# Patient Record
Sex: Male | Born: 1990 | Race: Black or African American | Hispanic: No | Marital: Single | Smoking: Former smoker
Health system: Southern US, Community
[De-identification: ages and names within clinical notes are randomized; demographics above are authoritative.]

---

## 2011-07-10 ENCOUNTER — Emergency Department (HOSPITAL_COMMUNITY): Payer: 59

## 2011-07-10 ENCOUNTER — Emergency Department (HOSPITAL_COMMUNITY)
Admission: EM | Admit: 2011-07-10 | Discharge: 2011-07-10 | Disposition: A | Discharge status: Home / Self Care | Payer: 59 | Attending: Emergency Medicine | Admitting: Emergency Medicine

## 2011-07-10 DIAGNOSIS — Y9367 Activity, basketball: Secondary | ICD-10-CM | POA: Insufficient documentation

## 2011-07-10 DIAGNOSIS — W219XXA Striking against or struck by unspecified sports equipment, initial encounter: Secondary | ICD-10-CM | POA: Insufficient documentation

## 2011-07-10 DIAGNOSIS — S20219A Contusion of unspecified front wall of thorax, initial encounter: Secondary | ICD-10-CM | POA: Insufficient documentation

## 2011-07-10 DIAGNOSIS — R079 Chest pain, unspecified: Secondary | ICD-10-CM | POA: Insufficient documentation

## 2012-07-11 ENCOUNTER — Encounter (HOSPITAL_COMMUNITY): Payer: Self-pay | Admitting: *Deleted

## 2012-07-11 ENCOUNTER — Emergency Department (HOSPITAL_COMMUNITY)
Admission: EM | Admit: 2012-07-11 | Discharge: 2012-07-11 | Disposition: A | Discharge status: Home / Self Care | Payer: 59 | Attending: Emergency Medicine | Admitting: Emergency Medicine

## 2012-07-11 DIAGNOSIS — R1013 Epigastric pain: Secondary | ICD-10-CM

## 2012-07-11 DIAGNOSIS — R112 Nausea with vomiting, unspecified: Secondary | ICD-10-CM | POA: Insufficient documentation

## 2012-07-11 DIAGNOSIS — F17200 Nicotine dependence, unspecified, uncomplicated: Secondary | ICD-10-CM | POA: Insufficient documentation

## 2012-07-11 LAB — CBC WITH DIFFERENTIAL/PLATELET
Basophils Relative: 0 % (ref 0–1)
Eosinophils Absolute: 0.2 10*3/uL (ref 0.0–0.7)
Eosinophils Relative: 2 % (ref 0–5)
HCT: 47.3 % (ref 39.0–52.0)
Lymphocytes Relative: 9 % — ABNORMAL LOW (ref 12–46)
Lymphs Abs: 1.1 10*3/uL (ref 0.7–4.0)
MCV: 89.9 fL (ref 78.0–100.0)
Neutro Abs: 10.2 10*3/uL — ABNORMAL HIGH (ref 1.7–7.7)
Platelets: 155 10*3/uL (ref 150–400)
RBC: 5.26 MIL/uL (ref 4.22–5.81)
WBC: 12.2 10*3/uL — ABNORMAL HIGH (ref 4.0–10.5)

## 2012-07-11 LAB — COMPREHENSIVE METABOLIC PANEL
ALT: 50 U/L (ref 0–53)
AST: 106 U/L — ABNORMAL HIGH (ref 0–37)
Calcium: 10.2 mg/dL (ref 8.4–10.5)
Creatinine, Ser: 0.78 mg/dL (ref 0.50–1.35)
Sodium: 142 mEq/L (ref 135–145)
Total Protein: 8.9 g/dL — ABNORMAL HIGH (ref 6.0–8.3)

## 2012-07-11 LAB — URINALYSIS, ROUTINE W REFLEX MICROSCOPIC
Glucose, UA: NEGATIVE mg/dL
Hgb urine dipstick: NEGATIVE
Leukocytes, UA: NEGATIVE
Specific Gravity, Urine: 1.016 (ref 1.005–1.030)
Urobilinogen, UA: 1 mg/dL (ref 0.0–1.0)

## 2012-07-11 MED ORDER — MORPHINE SULFATE 4 MG/ML IJ SOLN
4.0000 mg | Freq: Once | INTRAMUSCULAR | Status: AC
  Administered 2012-07-11: 4 mg via INTRAVENOUS
  Filled 2012-07-11: qty 1

## 2012-07-11 MED ORDER — FAMOTIDINE 20 MG PO TABS: 20.0000 mg | ORAL_TABLET | Freq: Two times a day (BID) | ORAL | Status: DC

## 2012-07-11 MED ORDER — FAMOTIDINE IN NACL 20-0.9 MG/50ML-% IV SOLN
20.0000 mg | Freq: Once | INTRAVENOUS | Status: AC
  Administered 2012-07-11: 20 mg via INTRAVENOUS
  Filled 2012-07-11: qty 50

## 2012-07-11 MED ORDER — SODIUM CHLORIDE 0.9 % IV BOLUS (SEPSIS)
1000.0000 mL | Freq: Once | INTRAVENOUS | Status: AC
  Administered 2012-07-11: 1000 mL via INTRAVENOUS

## 2012-07-11 MED ORDER — ONDANSETRON HCL 4 MG/2ML IJ SOLN
4.0000 mg | Freq: Once | INTRAMUSCULAR | Status: AC
  Administered 2012-07-11: 4 mg via INTRAVENOUS
  Filled 2012-07-11: qty 2

## 2012-07-11 MED ORDER — ONDANSETRON HCL 4 MG PO TABS: 4.0000 mg | ORAL_TABLET | Freq: Four times a day (QID) | ORAL | Status: AC

## 2012-07-11 MED ORDER — ONDANSETRON HCL 4 MG PO TABS: 4.0000 mg | ORAL_TABLET | Freq: Four times a day (QID) | ORAL | Status: DC

## 2012-07-11 NOTE — ED Provider Notes (Signed)
History     CSN: [id number]  Arrival date & time 07/11/12  0022   First MD Initiated Contact with Patient 07/11/12 0139      Chief Complaint  Patient presents with  . Abdominal Pain    (Consider location/radiation/quality/duration/timing/severity/associated sxs/prior treatment) HPI 21 yo male presents to the ER with complaint of upper abdominal pain with n/v.  No unusual foods, no sick contacts, no diarrhea, no fever, no alcohol consumption.  Parents report pt has had multiple episodes of vomiting.  Pt c/o pain as a knot.  No prior h/o same.  No prior surgeries. History reviewed. No pertinent past medical history.  History reviewed. No pertinent past surgical history.  History reviewed. No pertinent family history.  History  Substance Use Topics  . Smoking status: Current Everyday Smoker  . Smokeless tobacco: Not on file  . Alcohol Use: No      Review of Systems  All other systems reviewed and are negative.    Allergies  Review of patient's allergies indicates no known allergies.  Home Medications   Current Outpatient Rx  Name Route Sig Dispense Refill  . FAMOTIDINE 20 MG PO TABS Oral Take 1 tablet (20 mg total) by mouth 2 (two) times daily. For 5 days 20 tablet 0  . ONDANSETRON HCL 4 MG PO TABS Oral Take 1 tablet (4 mg total) by mouth every 6 (six) hours. PRN nausea 12 tablet 0    BP 105/69  Pulse 74  Temp 98 F (36.7 C) (Oral)  Resp 16  SpO2 97%  Physical Exam  Nursing note and vitals reviewed. Constitutional: He is oriented to person, place, and time. He appears well-developed and well-nourished.       Uncomfortable appearing  HENT:  Head: Normocephalic and atraumatic.  Nose: Nose normal.  Mouth/Throat: Oropharynx is clear and moist.  Eyes: Conjunctivae and EOM are normal. Pupils are equal, round, and reactive to light.  Neck: Normal range of motion. Neck supple. No JVD present. No tracheal deviation present. No thyromegaly present.  Cardiovascular:  Regular rhythm, normal heart sounds and intact distal pulses.  Exam reveals no gallop and no friction rub.   No murmur heard.      Mild tachycardia  Pulmonary/Chest: Effort normal and breath sounds normal. No stridor. No respiratory distress. He has no wheezes. He has no rales. He exhibits no tenderness.  Abdominal: Soft. Bowel sounds are normal. He exhibits no distension and no mass. There is tenderness (epigastric tenderness, no murphy sign or pain over mcburney). There is no rebound and no guarding.  Musculoskeletal: Normal range of motion. He exhibits no edema and no tenderness.  Lymphadenopathy:    He has no cervical adenopathy.  Neurological: He is oriented to person, place, and time. He exhibits normal muscle tone. Coordination normal.  Skin: Skin is dry. No rash noted. No erythema. No pallor.  Psychiatric: He has a normal mood and affect. His behavior is normal. Judgment and thought content normal.    ED Course  Procedures (including critical care time)  Labs Reviewed  COMPREHENSIVE METABOLIC PANEL - Abnormal; Notable for the following:    Potassium 3.3 (*)     Total Protein 8.9 (*)     Albumin 5.4 (*)     AST 106 (*)     Total Bilirubin 1.5 (*)     All other components within normal limits  URINALYSIS, ROUTINE W REFLEX MICROSCOPIC - Abnormal; Notable for the following:    APPearance HAZY (*)  Ketones, ur 15 (*)     All other components within normal limits  CBC WITH DIFFERENTIAL - Abnormal; Notable for the following:    WBC 12.2 (*)     Neutrophils Relative 84 (*)     Neutro Abs 10.2 (*)     Lymphocytes Relative 9 (*)     All other components within normal limits  LIPASE, BLOOD   No results found.   1. Epigastric pain   2. Nausea and vomiting       MDM  Pt with acute epigastric pain, n/v.  Pt with slight leukocytosis and elevated ast, may be due to vomiting.  Pt feeling better after iv fluids, medications.  Has tolerated po fluids.  Will d/c home with pepcid  and zofran and f/u with pcm        Olivia Mackie, MD 07/11/12 2030

## 2012-07-11 NOTE — ED Notes (Signed)
Pt

## 2012-07-11 NOTE — ED Notes (Signed)
Pt states that he has been having abdominal pain for the past hour. Severe feels like a knot in his stomach. Pt parents states that he vomited a couple of time during the last hour. Pt states it started after he ate.

## 2012-07-11 NOTE — ED Notes (Signed)
Pt. Given Ginger Ale per Dr. Norlene Campbell

## 2013-05-09 ENCOUNTER — Emergency Department (HOSPITAL_COMMUNITY): Payer: 59

## 2013-05-09 ENCOUNTER — Encounter (HOSPITAL_COMMUNITY): Payer: Self-pay | Admitting: Emergency Medicine

## 2013-05-09 ENCOUNTER — Emergency Department (HOSPITAL_COMMUNITY)
Admission: EM | Admit: 2013-05-09 | Discharge: 2013-05-09 | Disposition: A | Discharge status: Home / Self Care | Payer: 59 | Attending: Emergency Medicine | Admitting: Emergency Medicine

## 2013-05-09 DIAGNOSIS — Y92410 Unspecified street and highway as the place of occurrence of the external cause: Secondary | ICD-10-CM | POA: Insufficient documentation

## 2013-05-09 DIAGNOSIS — S0990XA Unspecified injury of head, initial encounter: Secondary | ICD-10-CM | POA: Insufficient documentation

## 2013-05-09 DIAGNOSIS — M25559 Pain in unspecified hip: Secondary | ICD-10-CM | POA: Insufficient documentation

## 2013-05-09 DIAGNOSIS — IMO0001 Reserved for inherently not codable concepts without codable children: Secondary | ICD-10-CM | POA: Insufficient documentation

## 2013-05-09 DIAGNOSIS — Z23 Encounter for immunization: Secondary | ICD-10-CM | POA: Insufficient documentation

## 2013-05-09 DIAGNOSIS — F17200 Nicotine dependence, unspecified, uncomplicated: Secondary | ICD-10-CM | POA: Insufficient documentation

## 2013-05-09 DIAGNOSIS — T148XXA Other injury of unspecified body region, initial encounter: Secondary | ICD-10-CM

## 2013-05-09 DIAGNOSIS — M25552 Pain in left hip: Secondary | ICD-10-CM

## 2013-05-09 DIAGNOSIS — IMO0002 Reserved for concepts with insufficient information to code with codable children: Secondary | ICD-10-CM | POA: Insufficient documentation

## 2013-05-09 DIAGNOSIS — Y9355 Activity, bike riding: Secondary | ICD-10-CM | POA: Insufficient documentation

## 2013-05-09 DIAGNOSIS — M25519 Pain in unspecified shoulder: Secondary | ICD-10-CM | POA: Insufficient documentation

## 2013-05-09 MED ORDER — BACITRACIN ZINC 500 UNIT/GM EX OINT
TOPICAL_OINTMENT | Freq: Two times a day (BID) | CUTANEOUS | Status: DC
  Administered 2013-05-09: 8 via TOPICAL
  Filled 2013-05-09: qty 7.2

## 2013-05-09 MED ORDER — SULFAMETHOXAZOLE-TRIMETHOPRIM 800-160 MG PO TABS: 1.0000 | ORAL_TABLET | Freq: Two times a day (BID) | ORAL | Status: DC

## 2013-05-09 MED ORDER — TETANUS-DIPHTH-ACELL PERTUSSIS 5-2.5-18.5 LF-MCG/0.5 IM SUSP
0.5000 mL | Freq: Once | INTRAMUSCULAR | Status: AC
  Administered 2013-05-09: 0.5 mL via INTRAMUSCULAR
  Filled 2013-05-09: qty 0.5

## 2013-05-09 MED ORDER — HYDROCODONE-ACETAMINOPHEN 5-325 MG PO TABS
1.0000 | ORAL_TABLET | Freq: Once | ORAL | Status: AC
  Administered 2013-05-09: 1 via ORAL
  Filled 2013-05-09: qty 1

## 2013-05-09 MED ORDER — CEPHALEXIN 500 MG PO CAPS: 500.0000 mg | ORAL_CAPSULE | Freq: Four times a day (QID) | ORAL | Status: DC
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
## 2013-05-12 NOTE — ED Provider Notes (Signed)
Medical screening examination/treatment/procedure(s) were performed by non-physician practitioner and as supervising physician I was immediately available for consultation/collaboration.   Lilith Solana L Dionne Rossa, MD 05/12/13 1531 

## 2013-05-24 ENCOUNTER — Ambulatory Visit (INDEPENDENT_AMBULATORY_CARE_PROVIDER_SITE_OTHER): Payer: 59 | Admitting: Internal Medicine

## 2013-05-24 ENCOUNTER — Ambulatory Visit: Payer: 59

## 2013-05-24 VITALS — BP 136/80 | HR 60 | Temp 98.1°F | Resp 16 | Ht 73.0 in | Wt 165.0 lb

## 2013-05-24 DIAGNOSIS — S71002S Unspecified open wound, left hip, sequela: Secondary | ICD-10-CM

## 2013-05-24 DIAGNOSIS — IMO0002 Reserved for concepts with insufficient information to code with codable children: Secondary | ICD-10-CM

## 2013-05-24 DIAGNOSIS — S300XXA Contusion of lower back and pelvis, initial encounter: Secondary | ICD-10-CM

## 2013-05-24 DIAGNOSIS — S2020XA Contusion of thorax, unspecified, initial encounter: Secondary | ICD-10-CM

## 2013-05-24 NOTE — Patient Instructions (Addendum)
Actuary Guidelines BICYCLE FACTS  Males are five times more likely to be killed as bicyclists than females. More than half of all bicyclist deaths occur to school age youth (ages 66 to 37).  Most bicyclist deaths result from bicycle-motor vehicle collisions. But injuries can happen anywhere - in parks, bike paths, and driveways - and often do not involve motor vehicles.  Head injuries are the most serious injury type and are the most common cause of deaths among bicyclists. The most severe injuries are those to the brain that cause permanent damage.  Studies have proven that bicycle helmet use can significantly reduce head injuries. HOW CAN YOU HELP STOP THESE TRAGEDIES?  Buy your child an approved bike helmet. Purchase one that has a sticker inside certifying the helmet meets standards of the [employer] and/or the [employer] ([phone]).  Let your child help pick out the helmet because it must be worn every time he/she rides. If you're a rider, buy one for yourself, too, and set a good example by wearing it.  Also encourage your child's friends to wear helmets.  Make certain your child's bike is the correct size, is safely maintained, and has reflectors.  Children under age 33 should not ride their bikes in the street. They are not able to identify and adjust to the many dangerous traffic situations.  Teach your child to always stop and look left-right-left before entering the road. This is a good pedestrian safety practice, too, for crossing the street.  If a bicyclist rides in the road, the cyclist must obey traffic laws that apply to motor vehicle operators.  Instruct your child on the bicycle rules of the road. Occupational psychologist are good sources for booklets that explain bicycle safety rules.  Enroll your child in a bike safety education program if one is available in your community.  Never  allow your child to ride at night or with audio headphones. Stress the need to remain alert since most drivers do not see riders. Bicyclists should ride single file on the right side of the road and signal their intentions to turn to other road users. TRAFFIC LAWS APPLY TO BICYCLES Every person riding a bicycle has the same rights and privileges of persons driving cars. The bicycle driver also has the same responsibilities as other drivers. Every person riding a bicycle normally has all of the rights and is subject to the duties which apply to the driver of any other vehicle under the rules of the road. RESPECTING THE LAWS IN YOUR COMMUNITY  You can be a welcome bicycle rider in your community if you are aware of the regulations and you follow them.  Check on the local ordinances that apply to bicycles before you begin riding in a new community.  Remember, you do come under the jurisdiction of Hospital doctor.  Any city or town shall have the power to make ordinances, bylaws or regulations respecting the use and equipment of bicycles.  Any bicyclist shall stop upon demand of a Tax adviser and permit his bicycle to be inspected. DRIVE SINGLE FILE  Two riders are safest when riding single file on the right side of the road.  Never block traffic by riding two abreast.  Persons riding bicycles two or more abreast shall not impede the normal and reasonable flow of traffic and on a laned road shall ride within a single lane. WHY WEAR A HELMET?  A crash  can happen to even the most careful person. On any ride you might catch a wheel in a crack in the road surface, skid on gravel, hit a wide pothole or drain gate or collide with a pedestrian, a dog or another vehicle.  Nobody expects to have a crash, but it is essential to have head protection in case you are involved in one.  Road rash and broken bones are painful, but they do heal. Head injuries, however, can cause permanent  damage. KEEP YOUR BICYCLE SAFE  Do not wait until you are driving to find out that your brakes do not work or that your chain is weak. Do a bicycle safety check before you ride.  NO bicycle should be operated unless the steering, brakes, tires and other required equipment are in safe condition. BRAKES  In order to ride safely you must be able to stop your bicycle when you want to.  Maintain your brakes in good condition and check them before each ride.  Every bicycle should be equipped with a brake or brakes which will enable its driver to stop the bicycle within 25 feet from a speed of 10 miles per hour on dry, level, clean pavement. DO NOT CARRY PASSENGERS  Two on a bicycle built for one is double trouble!  No bicycle shall be used to carry more persons at one time than the number for which it is designed and equipped. LIGHTS FOR NIGHT DRIVING  You must see and be seen to be safe on a bicycle. Most nighttime bicycle accidents are caused by bikes not being seen by other vehicles.  Bicycle lamps and reflectors are basic safety equipment. Every bicycle operated during darkness should be equipped with a lamp emitting a white light visible from a distance of 300 feet in front of the bicycle, and with a red reflector on the rear visible to a distance of 300 feet. A lamp emitting a red light visible from 300 feet may be used in addition to the red reflector. REFLECTORS  Reflectors will increase your ability to be seen.  Spoke reflectors, pedal reflectors, front and rear reflectors, as well as reflectorized tire walls, reflective tape on your bicycle frame, and reflectorized clothing all will help you enjoy safe night riding.  All bicycles should be equipped with a reflector on each pedal visible from a distance of 200 feet from the front or rear of the bicycle, and with a red rear reflector visible at 300 feet during darkness. HANDLEBARS AND SEAT  Be sure your bicycle fits the  rider.  Keep handlebars and seat adjusted tightly and correctly. Handlebars should be the same height as the seat and tightened.  Always ride upon or astride a permanent and regular seat attached to the bicycle.  No person should operate on the roadway any bicycle equipped with handlebars so raised that the operator must elevate his hands above the level of his shoulders to grasp the normal steering grip area. DO NOT CARRY BULKY PACKAGES OR BUNDLES  Too large a package in front of you will obscure your vision.  Carrying a large or heavy package may cause you to lose your balance or to swerve and fall.  No person should carry a package, bundle or article which prevents the driver from keeping at least one hand upon the handlebars. DO NOT CLING TO OTHER VEHICLES  Clinging to a moving vehicle is extremely dangerous. The driver of the tow vehicle and other vehicles are unaware of your presence  and may endanger your life.  The tow speed and your lack of visibility increase your chances of losing control of your bicycle.  No person riding a bicycle should hold on to, or hitch on to, any other vehicle moving upon a road way. FIVE COMMON BICYCLE AND MOTOR VEHICLE CRASHES MID-BLOCK RIDEOUT  This is the most frequent crash type for young riders and occurs soon after the bicyclist enters the road from a driveway, alley or curb without slowing, stopping or looking for traffic. The bicyclist's sudden entry leaves the motorist too little time to avoid the collision.  "Bicyclist" - Stop and look left-right-left for traffic before entering the road. WRONG WAY RIDING  Motorists do not expect traffic to be approaching from the wrong way. It is the exception to the rule that creates the condition for a crash, which is the main reason why it is unlawful to ride facing traffic.  "Bicyclist" - Go with the flow! Ride Right - with traffic just like cars do. MOTORIST OVERTAKING CYCLIST  This crash occurs  because the motorist fails to see and react to the bicyclist until it is too late. This crash type is more frequent at night, on narrow rural roads, involves driver inattention and also involves drunk driving.  "Bicyclist" - Avoid riding at night, on narrow roads and where highway speeds are over 35 mph. Always use lights and reflectors if you must ride at night. BICYCLIST LEFT TURN OR SUDDEN SWERVE  The bicyclist swerves to the left without checking traffic and without signaling, and moves into the path of an overtaking motor vehicle. The motorist does not have enough time to avoid the collision.  "Bicyclist" - Be predictable. Always ride in a straight line. When preparing to change your lane position, look behind you and yield to on-coming traffic. For making a left turn, give the left hand signal and when it is safe, move to the left lane. Give the left-hand signal again, and then make your turn when it is safe to do so. STOP SIGN RIDE-OUT  This crash occurs when the bicyclist enters an intersection that is controlled by a sign and collides with a motor vehicle approaching from an uncontrolled lane. The bicyclist fails to stop/slow and look for traffic before entering the intersection. This improper action leaves the motorist too little time to avoid a collision.  "Bicyclist" - When driving your vehicle, obey all traffic signs and signals. At busy intersections, get off your bike and walk across the road as you do when you are a pedestrian. DID YOU KNOW?  Three out of four cyclists killed in crashes die of head injuries?  The majority of all bicyclist injuries are head related?  Over a half million Americans will be seriously injured this year in bicycle crashes?  Your brain is extremely sensitive to any impact, even bicycling at a very low speed?  Over 600 children will die from bicycle crashes this year, most only a few blocks from home? REMEMBER  Always use hand signals.  Always  obey all traffic laws.  Always wear helmets. Document Released: 12/04/2003 Document Revised: 02/23/2012 Document Reviewed: 11/27/2008 [facility] Patient Information 2014 [place], [place]. Wound Care Wound care helps prevent pain and infection.  You may need a tetanus shot if:  You cannot remember when you had your last tetanus shot.  You have never had a tetanus shot.  The injury broke your skin. If you need a tetanus shot and you choose not to have one,  you may get tetanus. Sickness from tetanus can be serious. HOME CARE   Only take medicine as told by your doctor.  Clean the wound daily with mild soap and water.  Change any bandages (dressings) as told by your doctor.  Put medicated cream and a bandage on the wound as told by your doctor.  Change the bandage if it gets wet, dirty, or starts to smell.  Take showers. Do not take baths, swim, or do anything that puts your wound under water.  Rest and raise (elevate) the wound until the pain and puffiness (swelling) are better.  Keep all doctor visits as told. GET HELP RIGHT AWAY IF:   Yellowish-white fluid (pus) comes from the wound.  Medicine does not lessen your pain.  There is a red streak going away from the wound.  You have a fever. MAKE SURE YOU:   Understand these instructions.  Will watch your condition.  Will get help right away if you are not doing well or get worse. Document Released: 09/09/2008 Document Revised: 02/23/2012 Document Reviewed: 04/06/2011 [facility] Patient Information 2014 [place], [place].
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
## 2013-05-30 ENCOUNTER — Encounter: Payer: Self-pay | Admitting: Radiology

## 2013-06-06 ENCOUNTER — Telehealth: Payer: Self-pay

## 2013-06-06 NOTE — Telephone Encounter (Signed)
pts father left vmail on referrals - difficult to understand b/c kept cutting out. Sounds like they would like a call about pts hip xray.  Best: [phone]  bf

## 2013-06-06 NOTE — Telephone Encounter (Signed)
IMPRESSION:  No fracture or dislocation is seen. Called, and patient has followed up with ortho and he is fine.

## 2013-11-16 ENCOUNTER — Ambulatory Visit: Payer: 59

## 2013-11-16 ENCOUNTER — Ambulatory Visit (INDEPENDENT_AMBULATORY_CARE_PROVIDER_SITE_OTHER): Payer: 59 | Admitting: Family Medicine

## 2013-11-16 VITALS — BP 106/64 | HR 76 | Temp 98.0°F | Resp 16 | Ht 72.0 in | Wt 162.6 lb

## 2013-11-16 DIAGNOSIS — M76899 Other specified enthesopathies of unspecified lower limb, excluding foot: Secondary | ICD-10-CM

## 2013-11-16 DIAGNOSIS — M7062 Trochanteric bursitis, left hip: Secondary | ICD-10-CM

## 2013-11-16 DIAGNOSIS — M25559 Pain in unspecified hip: Secondary | ICD-10-CM

## 2013-11-16 DIAGNOSIS — M25552 Pain in left hip: Secondary | ICD-10-CM

## 2013-11-16 MED ORDER — PREDNISONE 20 MG PO TABS: ORAL_TABLET | ORAL | Status: DC
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
## 2013-11-16 NOTE — Patient Instructions (Signed)
Hip Bursitis  Bursitis is a swelling and soreness (inflammation) of a fluid-filled sac (bursa). This sac overlies and protects the joints.   CAUSES   · Injury.  · Overuse of the muscles surrounding the joint.  · Arthritis.  · Gout.  · Infection.  · Cold weather.  · Inadequate warm-up and conditioning prior to activities.  The cause may not be known.   SYMPTOMS   · Mild to severe irritation.  · Tenderness and swelling over the outside of the hip.  · Pain with motion of the hip.  · If the bursa becomes infected, a fever may be present. Redness, tenderness, and warmth will develop over the hip.  Symptoms usually lessen in 3 to 4 weeks with treatment, but can come back.  TREATMENT  If conservative treatment does not work, your caregiver may advise draining the bursa and injecting cortisone into the area. This may speed up the healing process. This may also be used as an initial treatment of choice.  HOME CARE INSTRUCTIONS   · Apply ice to the affected area for 15-20 minutes every 3 to 4 hours while awake for the first 2 days. Put the ice in a plastic bag and place a towel between the bag of ice and your skin.  · Rest the painful joint as much as possible, but continue to put the joint through a normal range of motion at least 4 times per day. When the pain lessens, begin normal, slow movements and usual activities to help prevent stiffness of the hip.  · Only take over-the-counter or prescription medicines for pain, discomfort, or fever as directed by your caregiver.  · Use crutches to limit weight bearing on the hip joint, if advised.  · Elevate your painful hip to reduce swelling. Use pillows for propping and cushioning your legs and hips.  · Gentle massage may provide comfort and decrease swelling.  SEEK IMMEDIATE MEDICAL CARE IF:   · Your pain increases even during treatment, or you are not improving.  · You have a fever.  · You have heat and inflammation over the involved bursa.  · You have any other questions or  concerns.  MAKE SURE YOU:   · Understand these instructions.  · Will watch your condition.  · Will get help right away if you are not doing well or get worse.  Document Released: 05/23/2002 Document Revised: 02/23/2012 Document Reviewed: 12/20/2008  [employer]® Patient Information ©2014 [employer].
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
## 2013-12-30 IMAGING — CR DG PELVIS 1-2V
1 series · 1 of 1 positions shown · non-contrast
Comparison: None.

CLINICAL DATA: pelvic pain

PELVIS - 1-2 VIEW

[AP]
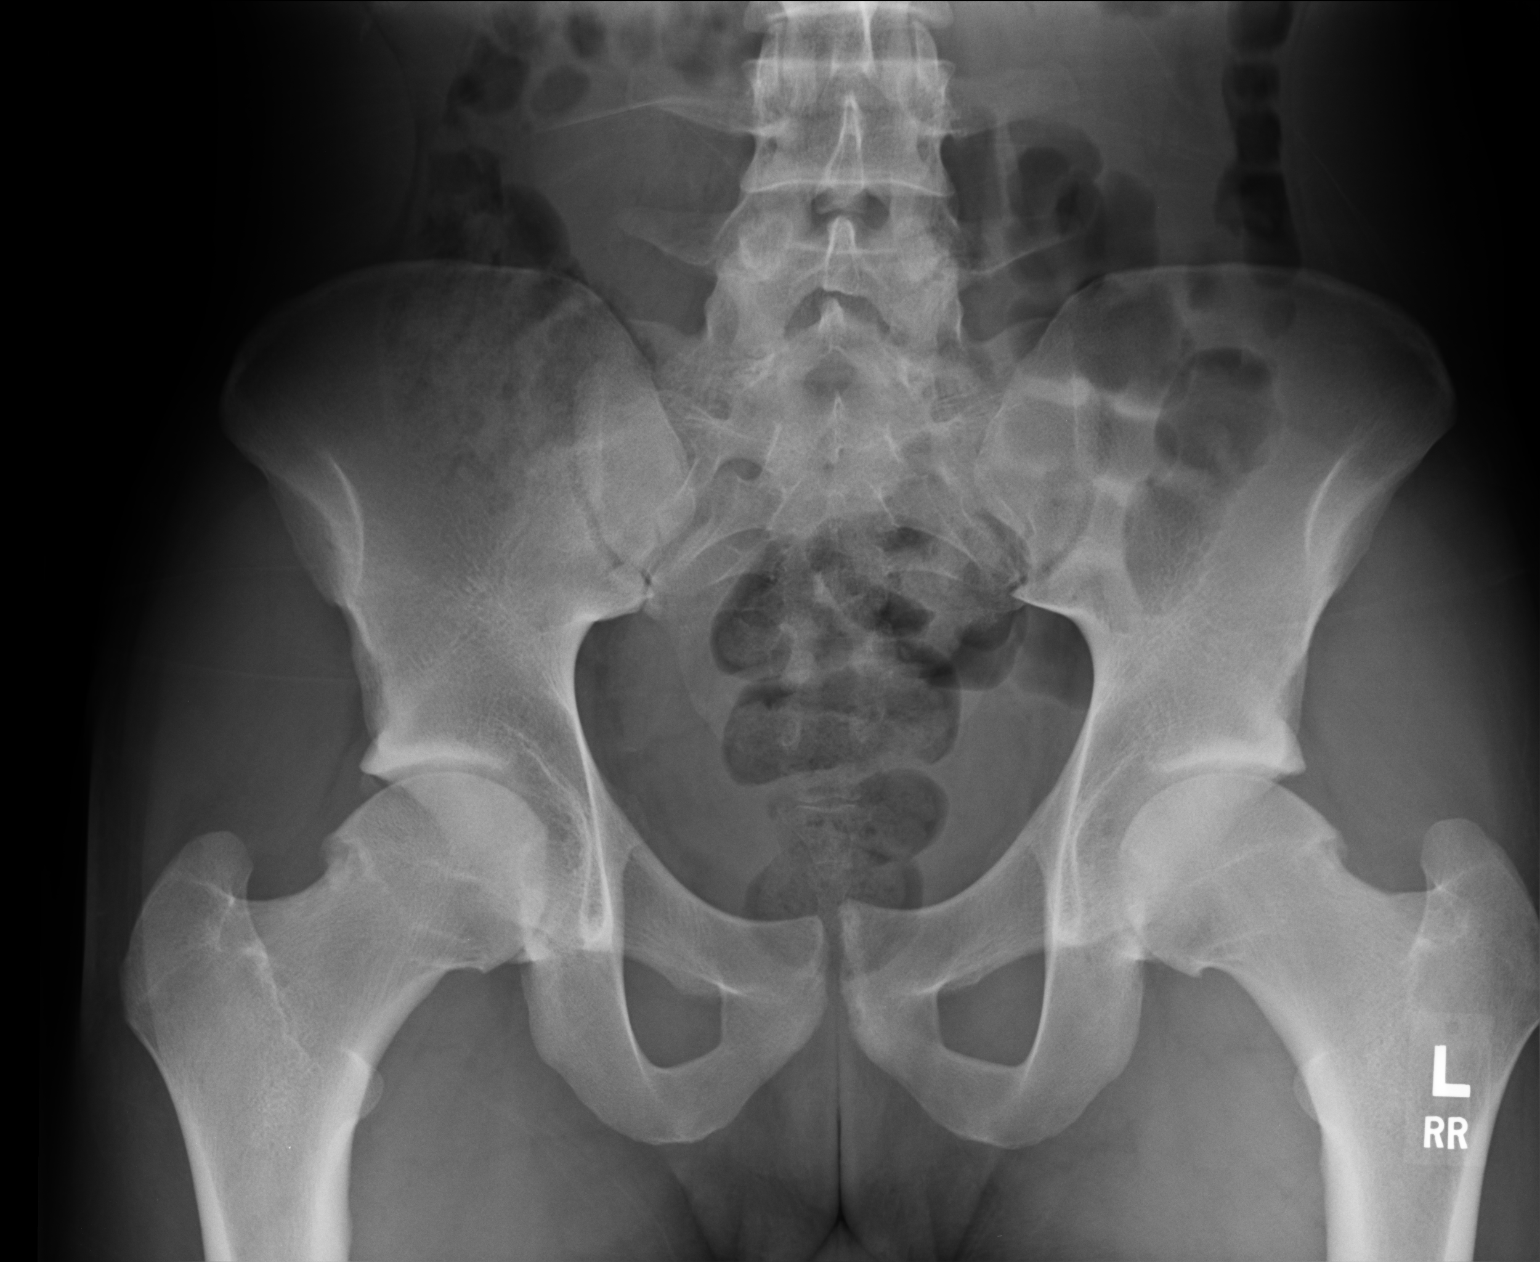

[1 of 1 positions shown; findings below may reference images not displayed]

FINDINGS: Degenerative spurring of both femoral heads noted.  This
is advanced for age, and there is mild loss of articular space in
both hips.

Bony pelvis otherwise unremarkable.
IMPRESSION: 1.  Advanced for age degenerative arthropathy of both hips.

## 2016-09-24 ENCOUNTER — Ambulatory Visit (INDEPENDENT_AMBULATORY_CARE_PROVIDER_SITE_OTHER): Payer: 59 | Admitting: Family Medicine

## 2016-09-24 VITALS — BP 108/70 | HR 71 | Temp 97.5°F | Resp 16 | Ht 72.0 in | Wt 154.0 lb

## 2016-09-24 DIAGNOSIS — R5382 Chronic fatigue, unspecified: Secondary | ICD-10-CM

## 2016-09-24 DIAGNOSIS — R632 Polyphagia: Secondary | ICD-10-CM

## 2016-09-24 DIAGNOSIS — R5383 Other fatigue: Secondary | ICD-10-CM | POA: Diagnosis not present

## 2016-09-24 LAB — POCT CBC
Granulocyte percent: 50.9 %G (ref 37–80)
HCT, POC: 43.5 % (ref 43.5–53.7)
HEMOGLOBIN: 15.3 g/dL (ref 14.1–18.1)
LYMPH, POC: 1.9 (ref 0.6–3.4)
MCH: 32.3 pg — AB (ref 27–31.2)
MCHC: 35.1 g/dL (ref 31.8–35.4)
MCV: 92 fL (ref 80–97)
MID (cbc): 0.3 (ref 0–0.9)
MPV: 8.2 fL (ref 0–99.8)
PLATELET COUNT, POC: 146 10*3/uL (ref 142–424)
POC Granulocyte: 2.3 (ref 2–6.9)
POC LYMPH PERCENT: 41.7 %L (ref 10–50)
POC MID %: 7.4 %M (ref 0–12)
RBC: 4.73 M/uL (ref 4.69–6.13)
RDW, POC: 13.1 %
WBC: 4.5 10*3/uL — AB (ref 4.6–10.2)

## 2016-09-24 LAB — POCT GLYCOSYLATED HEMOGLOBIN (HGB A1C): Hemoglobin A1C: 5.3

## 2016-09-24 LAB — COMPREHENSIVE METABOLIC PANEL
ALBUMIN: 4.7 g/dL (ref 3.6–5.1)
ALT: 8 U/L — ABNORMAL LOW (ref 9–46)
AST: 12 U/L (ref 10–40)
Alkaline Phosphatase: 48 U/L (ref 40–115)
BUN: 7 mg/dL (ref 7–25)
CALCIUM: 9.8 mg/dL (ref 8.6–10.3)
CHLORIDE: 106 mmol/L (ref 98–110)
CO2: 26 mmol/L (ref 20–31)
CREATININE: 0.86 mg/dL (ref 0.60–1.35)
Glucose, Bld: 72 mg/dL (ref 65–99)
POTASSIUM: 3.6 mmol/L (ref 3.5–5.3)
Sodium: 144 mmol/L (ref 135–146)
TOTAL PROTEIN: 6.8 g/dL (ref 6.1–8.1)
Total Bilirubin: 0.6 mg/dL (ref 0.2–1.2)

## 2016-09-24 LAB — TSH: TSH: 0.93 m[IU]/L (ref 0.40–4.50)

## 2016-09-24 LAB — POC MICROSCOPIC URINALYSIS (UMFC): Mucus: ABSENT

## 2016-09-24 NOTE — Progress Notes (Signed)
   Subjective:    Patient ID: Chad Nicholson, male    DOB: 11-Oct-1991, 25 y.o.   MRN: [id number]  HPI  Patient presents for fatigue.   Fatigue Patient reports increased fatigue over the past few months. Reports feeling constantly tired. Is sleeping about 7-8 hours per night, and reports feeling well-rested when he wakes up in the morning.   Also reports increased hunger. Says he can eat a large meal and is hungry 30 minutes later. Endorses weight loss of about 10 pounds despite increased hunger.    Is sexually active with his girlfriend. Does not use protection. Denies penile discharge, dysuria, hematuria. Is not concerned about STDs.  No new life stressors. Has worked at [employer] for the past two years and eats mostly doughnuts and coffee. Does not exercise.   Review of Systems Denies abdominal pain, nausea, vomiting, diarrhea, coughing, sore throat, night sweats.     Objective:   Physical Exam  Constitutional: He is oriented to person, place, and time. He appears well-developed and well-nourished. No distress.  HENT:  Head: Normocephalic and atraumatic.  Mouth/Throat: Oropharynx is clear and moist. No oropharyngeal exudate.  Neck: Normal range of motion. Neck supple.  Cardiovascular: Normal rate, regular rhythm and normal heart sounds.   No murmur heard. Pulmonary/Chest: Effort normal and breath sounds normal. No respiratory distress. He has no wheezes.  Abdominal: Soft. Bowel sounds are normal. He exhibits no distension. There is no tenderness.  Lymphadenopathy:    He has no cervical adenopathy.  Neurological: He is alert and oriented to person, place, and time.  Skin: Skin is warm and dry.  Psychiatric: He has a normal mood and affect. His behavior is normal.  Vitals reviewed.     Assessment & Plan:  Fatigue Etiology unclear. Persistent for past few months. Receiving adequate sleep and feels well-rested upon awakening. Weight loss of 5-10 pounds throughout the past two  years judging from prior notes. A1C normal, making diabetes less likely cause. WBC WNL.  - CBC, TSH, UA, CMP, HIV pending - Will call patient with results when available and proceed accordingly  Tarri AbernethyAbigail J Keasia Dubose, MD, MPH

## 2016-09-24 NOTE — Assessment & Plan Note (Addendum)
Etiology unclear. Persistent for past few months. Receiving adequate sleep and feels well-rested upon awakening. Weight loss of 5-10 pounds throughout the past two years judging from prior notes. A1C normal, making diabetes less likely cause. WBC WNL.  - CBC, TSH, UA, CMP, HIV pending - Will call patient with results when available and proceed accordingly

## 2016-09-24 NOTE — Patient Instructions (Addendum)
It was nice meeting you today Chad Nicholson!  The lab results we know so far have all been normal.   We will call you with the results of the other lab tests within the next few days as soon as they are available.   If you have any questions or concerns, please feel free to call the clinic.   Be well,  Dr. Natale MilchLancaster    IF you received an x-ray today, you will receive an invoice from [facility]. Please contact [facility] at [phone] with questions or concerns regarding your invoice.   IF you received labwork today, you will receive an invoice from [facility]. Please contact [facility] at [phone] with questions or concerns regarding your invoice.   Our billing staff will not be able to assist you with questions regarding bills from these companies.  You will be contacted with the lab results as soon as they are available. The fastest way to get your results is to activate your My Chart account. Instructions are located on the last page of this paperwork. If you have not heard from us regarding the results in 2 weeks, please contact this office.

## 2016-09-25 LAB — HIV ANTIBODY (ROUTINE TESTING W REFLEX): HIV 1&2 Ab, 4th Generation: NONREACTIVE

## 2017-07-07 ENCOUNTER — Ambulatory Visit (INDEPENDENT_AMBULATORY_CARE_PROVIDER_SITE_OTHER): Payer: 59 | Admitting: Physician Assistant

## 2017-07-07 ENCOUNTER — Encounter: Payer: Self-pay | Admitting: Physician Assistant

## 2017-07-07 VITALS — BP 131/74 | HR 71 | Temp 98.4°F | Resp 18 | Ht 71.93 in | Wt 165.6 lb

## 2017-07-07 DIAGNOSIS — R369 Urethral discharge, unspecified: Secondary | ICD-10-CM | POA: Diagnosis not present

## 2017-07-07 DIAGNOSIS — Z7251 High risk heterosexual behavior: Secondary | ICD-10-CM | POA: Diagnosis not present

## 2017-07-07 LAB — POC MICROSCOPIC URINALYSIS (UMFC): Mucus: ABSENT

## 2017-07-07 LAB — POCT URINALYSIS DIP (MANUAL ENTRY)
Bilirubin, UA: NEGATIVE
Blood, UA: NEGATIVE
Glucose, UA: NEGATIVE mg/dL
Ketones, POC UA: NEGATIVE mg/dL
Nitrite, UA: NEGATIVE
Protein Ur, POC: 30 mg/dL — AB
Spec Grav, UA: 1.015
Urobilinogen, UA: 0.2 U/dL
pH, UA: >=9 — AB

## 2017-07-07 MED ORDER — AZITHROMYCIN 500 MG PO TABS: 1000.0000 mg | ORAL_TABLET | Freq: Every day | ORAL | 0 refills | Status: DC

## 2017-07-07 MED ORDER — CEFTRIAXONE SODIUM 250 MG IJ SOLR
250.0000 mg | Freq: Once | INTRAMUSCULAR | Status: AC
  Administered 2017-07-07: 250 mg via INTRAMUSCULAR

## 2017-07-07 NOTE — Progress Notes (Signed)
Momodou Consiglio  MRN: [id number] DOB: 12-21-1990  Subjective:  Chad Nicholson is a 26 y.o. male seen in office today for a chief complaint of yellowish, green penile discharge x 1 day. Had associated dysuria. Denies penile pain, testicular pain, testicular swelling, genital sores, urinary frequency, urgency, rectal pain, and hematuria. He is sexually active with multiple partners. Has had ~10 sexual partners in his lifetime. Does not always use condoms.Has never been tested for STDs. Has not been informed that any of his recent partners had an STD.   Review of Systems  Constitutional: Negative for chills, diaphoresis and fever.  Gastrointestinal: Negative for nausea and vomiting.    Patient Active Problem List   Diagnosis Date Noted  . Fatigue 09/24/2016    No current outpatient prescriptions on file prior to visit.   No current facility-administered medications on file prior to visit.     No Known Allergies   Objective:  BP 131/74 (BP Location: Right Arm, Patient Position: Sitting, Cuff Size: Normal)   Pulse 71   Temp 98.4 F (36.9 C) (Oral)   Resp 18   Ht 5' 11.93" (1.827 m)   Wt 165 lb 9.6 oz (75.1 kg)   SpO2 97%   BMI 22.50 kg/m   Physical Exam  Constitutional: He is oriented to person, place, and time and well-developed, well-nourished, and in no distress.  HENT:  Head: Normocephalic and atraumatic.  Eyes: Conjunctivae are normal.  Neck: Normal range of motion.  Pulmonary/Chest: Effort normal.  Genitourinary: Penis normal. He exhibits no abnormal testicular mass and no testicular tenderness. Penis exhibits no lesions and no edema. No discharge found.  Neurological: He is alert and oriented to person, place, and time. Gait normal.  Skin: Skin is warm and dry.  Psychiatric: Affect normal.  Vitals reviewed.  Results for orders placed or performed in visit on 07/07/17 (from the past 24 hour(s))  POCT urinalysis dipstick     Status: Abnormal   Collection Time:  07/07/17  3:06 PM  Result Value Ref Range   Color, UA yellow yellow   Clarity, UA clear clear   Glucose, UA negative negative mg/dL   Bilirubin, UA negative negative   Ketones, POC UA negative negative mg/dL   Spec Grav, UA 7.829 5.621 - 1.025   Blood, UA negative negative   pH, UA >=9.0 (A) 5.0 - 8.0   Protein Ur, POC =30 (A) negative mg/dL   Urobilinogen, UA 0.2 0.2 or 1.0 E.U./dL   Nitrite, UA Negative Negative   Leukocytes, UA Small (1+) (A) Negative  POCT Microscopic Urinalysis ([facility])     Status: Abnormal   Collection Time: 07/07/17  3:36 PM  Result Value Ref Range   WBC,UR,HPF,POC Few (A) None WBC/hpf   RBC,UR,HPF,POC None None RBC/hpf   Bacteria None None, Too numerous to count   Mucus Absent Absent   Epithelial Cells, UR Per Microscopy Few (A) None, Too numerous to count cells/hpf    Assessment and Plan :  1. Penile discharge No penile discharge noted on exam; however due to symptoms and sexual history, will treat empirically for gonorrhea and chlamydia. Labs pending.  - cefTRIAXone (ROCEPHIN) injection 250 mg; Inject 250 mg into the muscle once. - azithromycin (ZITHROMAX) 500 MG tablet; Take 2 tablets (1,000 mg total) by mouth daily.  Dispense: 2 tablet; Refill: 0  2. High risk sexual behavior Discussed safe sex practices with patient and gave him educational material about safe sex.   - POCT urinalysis dipstick - HIV  antibody - GC/Chlamydia Probe Amp - RPR - Trichomonas vaginalis, RNA - Hepatitis panel, acute - POCT Microscopic Urinalysis ([facility]) - Urine Culture  Benjiman CoreBrittany Wiseman, PA-C  Primary [facility] at [facility] 07/07/2017 5:56 PM

## 2017-07-07 NOTE — Patient Instructions (Addendum)
We have treated you empirically today for underlying gonorrhea and chlamydia. We will contact you with your lab results within the next week. Avoid sexual intercourse until your results return. If any of your symptoms worsen while awaiting your results, please seek care immediately. Thank you for letting me participate in your health and well being.    IF you received an x-ray today, you will receive an invoice from [facility]. Please contact [facility] at [phone] with questions or concerns regarding your invoice.   IF you received labwork today, you will receive an invoice from [facility]. Please contact [facility] at [phone] with questions or concerns regarding your invoice.   Our billing staff will not be able to assist you with questions regarding bills from these companies.  You will be contacted with the lab results as soon as they are available. The fastest way to get your results is to activate your My Chart account. Instructions are located on the last page of this paperwork. If you have not heard from [place] regarding the results in 2 weeks, please contact this office.    st Safe Sex Practicing safe sex means taking steps before and during sex to reduce your risk of:  Getting an STD (sexually transmitted disease).  Giving your partner an STD.  Unwanted pregnancy.  How can I practice safe sex?  To practice safe sex:  Limit your sexual partners to only one partner who is having sex with only you.  Avoid using alcohol and recreational drugs before having sex. These substances can affect your judgment.  Before having sex with a new partner: ? Talk to your partner about past partners, past STDs, and drug use. ? You and your partner should be screened for STDs and discuss the results with each other.  Check your body regularly for sores, blisters, rashes, or unusual discharge. If you notice any of these problems, visit your health care provider.  If you  have symptoms of an infection or you are being treated for an STD, avoid sexual contact.  While having sex, use a condom. Make sure to: ? Use a condom every time you have vaginal, oral, or anal sex. Both females and males should wear condoms during oral sex. ? Keep condoms in place from the beginning to the end of sexual activity. ? Use a latex condom, if possible. Latex condoms offer the best protection. ? Use only water-based lubricants or oils to lubricate a condom. Using petroleum-based lubricants or oils will weaken the condom and increase the chance that it will break.  See your health care provider for regular screenings, exams, and tests for STDs.  Talk with your health care provider about the form of birth control (contraception) that is best for you.  Get vaccinated against hepatitis B and human papillomavirus (HPV).  If you are at risk of being infected with HIV (human immunodeficiency virus), talk with your health care provider about taking a prescription medicine to prevent HIV infection. You are considered at risk for HIV if: ? You are a man who has sex with other men. ? You are a heterosexual man or woman who is sexually active with more than one partner. ? You take drugs by injection. ? You are sexually active with a partner who has HIV.  This information is not intended to replace advice given to you by your health care provider. Make sure you discuss any questions you have with your health care provider. Document Released: 01/08/2005 Document Revised: 04/16/2016 Document  Reviewed: 10/21/2015 Elsevier Interactive Patient Education  [employer]2018 [employer]

## 2017-07-08 LAB — HEPATITIS PANEL, ACUTE
HEP B C IGM: NEGATIVE
Hep A IgM: NEGATIVE
Hepatitis B Surface Ag: NEGATIVE

## 2017-07-08 LAB — URINE CULTURE: ORGANISM ID, BACTERIA: NO GROWTH

## 2017-07-08 LAB — HIV ANTIBODY (ROUTINE TESTING W REFLEX): HIV SCREEN 4TH GENERATION: NONREACTIVE

## 2017-07-08 LAB — RPR: RPR Ser Ql: NONREACTIVE

## 2017-07-08 LAB — GC/CHLAMYDIA PROBE AMP
Chlamydia trachomatis, NAA: NEGATIVE
NEISSERIA GONORRHOEAE BY PCR: POSITIVE — AB

## 2017-07-09 LAB — TRICHOMONAS VAGINALIS, PROBE AMP: Trich vag by NAA: NEGATIVE

## 2017-07-15 NOTE — Progress Notes (Signed)
Patient was informed.

## 2018-12-17 ENCOUNTER — Encounter (HOSPITAL_COMMUNITY): Payer: Self-pay

## 2018-12-17 ENCOUNTER — Emergency Department (HOSPITAL_COMMUNITY): Payer: No Typology Code available for payment source

## 2018-12-17 ENCOUNTER — Emergency Department (HOSPITAL_COMMUNITY)
Admission: EM | Admit: 2018-12-17 | Discharge: 2018-12-17 | Disposition: A | Discharge status: Home / Self Care | Payer: No Typology Code available for payment source | Attending: Emergency Medicine | Admitting: Emergency Medicine

## 2018-12-17 DIAGNOSIS — M542 Cervicalgia: Secondary | ICD-10-CM | POA: Diagnosis not present

## 2018-12-17 DIAGNOSIS — M79605 Pain in left leg: Secondary | ICD-10-CM | POA: Insufficient documentation

## 2018-12-17 DIAGNOSIS — Z87891 Personal history of nicotine dependence: Secondary | ICD-10-CM | POA: Insufficient documentation

## 2018-12-17 DIAGNOSIS — M546 Pain in thoracic spine: Secondary | ICD-10-CM | POA: Diagnosis not present

## 2018-12-17 DIAGNOSIS — M79632 Pain in left forearm: Secondary | ICD-10-CM | POA: Diagnosis not present

## 2018-12-17 MED ORDER — METHOCARBAMOL 500 MG PO TABS: 500.0000 mg | ORAL_TABLET | Freq: Once | ORAL | Status: DC

## 2018-12-17 NOTE — ED Notes (Signed)
Pt belligerently saying that he is going to leave because he is tired of waiting.

## 2018-12-17 NOTE — ED Notes (Addendum)
Pt left AMA due to the "long waiting time." Pt was at nurses station acting belligerent and threatening staff. Pt was aware that writer was about to see pt to give medications but pt continued to explain to staff that he was leaving. Pt was ambulatory upon leaving.
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
## 2018-12-18 ENCOUNTER — Ambulatory Visit
Admission: EM | Admit: 2018-12-18 | Discharge: 2018-12-18 | Disposition: A | Discharge status: Home / Self Care | Payer: Managed Care, Other (non HMO) | Attending: Family Medicine | Admitting: Family Medicine

## 2018-12-18 ENCOUNTER — Other Ambulatory Visit: Payer: Self-pay

## 2018-12-18 ENCOUNTER — Encounter: Payer: Self-pay | Admitting: Emergency Medicine

## 2018-12-18 DIAGNOSIS — S161XXA Strain of muscle, fascia and tendon at neck level, initial encounter: Secondary | ICD-10-CM | POA: Diagnosis not present

## 2018-12-18 DIAGNOSIS — S63641A Sprain of metacarpophalangeal joint of right thumb, initial encounter: Secondary | ICD-10-CM

## 2018-12-18 DIAGNOSIS — M79604 Pain in right leg: Secondary | ICD-10-CM

## 2018-12-18 MED ORDER — IBUPROFEN 800 MG PO TABS: 800.0000 mg | ORAL_TABLET | Freq: Three times a day (TID) | ORAL | 0 refills | Status: DC

## 2018-12-18 MED ORDER — HYDROCODONE-ACETAMINOPHEN 5-325 MG PO TABS: 1.0000 | ORAL_TABLET | ORAL | 0 refills | Status: DC | PRN

## 2018-12-18 MED ORDER — TIZANIDINE HCL 4 MG PO TABS: 4.0000 mg | ORAL_TABLET | Freq: Four times a day (QID) | ORAL | 0 refills | Status: DC | PRN

## 2018-12-18 NOTE — Discharge Instructions (Signed)
Take ibuprofen 3 x ad ay with food Take the muscle relaxer as needed Take the pain medicine if pain severe Do not take pain  medicine and drive Activity as tolerated Follow up with your doctor next week
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
## 2018-12-21 ENCOUNTER — Ambulatory Visit
Admission: EM | Admit: 2018-12-21 | Discharge: 2018-12-21 | Disposition: A | Discharge status: Home / Self Care | Payer: Managed Care, Other (non HMO) | Attending: Physician Assistant | Admitting: Physician Assistant

## 2018-12-21 ENCOUNTER — Encounter: Payer: Self-pay | Admitting: Emergency Medicine

## 2018-12-21 DIAGNOSIS — M25532 Pain in left wrist: Secondary | ICD-10-CM

## 2018-12-21 DIAGNOSIS — M25562 Pain in left knee: Secondary | ICD-10-CM | POA: Diagnosis not present

## 2018-12-21 NOTE — ED Notes (Signed)
Patient able to ambulate independently  
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
## 2018-12-21 NOTE — Discharge Instructions (Signed)
Continue medicines as directed. Ice compress, rest. Knee sleeve and wrist splint during activity. As discussed, this can take a few weeks to completely resolve, but should be feeling better each week. Follow up with orthopedics for further evaluation if symptoms not improving.

## 2019-05-12 ENCOUNTER — Encounter: Payer: Self-pay | Admitting: Family Medicine

## 2019-05-12 ENCOUNTER — Ambulatory Visit (INDEPENDENT_AMBULATORY_CARE_PROVIDER_SITE_OTHER): Payer: 59 | Admitting: Family Medicine

## 2019-05-12 ENCOUNTER — Other Ambulatory Visit: Payer: Self-pay

## 2019-05-12 ENCOUNTER — Telehealth: Payer: Self-pay

## 2019-05-12 ENCOUNTER — Other Ambulatory Visit (HOSPITAL_COMMUNITY)
Admission: RE | Admit: 2019-05-12 | Discharge: 2019-05-12 | Disposition: A | Discharge status: Home / Self Care | Payer: Managed Care, Other (non HMO) | Source: Ambulatory Visit | Attending: Family Medicine | Admitting: Family Medicine

## 2019-05-12 VITALS — BP 155/82 | HR 80 | Temp 98.9°F | Ht 73.0 in | Wt 158.0 lb

## 2019-05-12 DIAGNOSIS — Z202 Contact with and (suspected) exposure to infections with a predominantly sexual mode of transmission: Secondary | ICD-10-CM | POA: Diagnosis present

## 2019-05-12 DIAGNOSIS — R300 Dysuria: Secondary | ICD-10-CM | POA: Diagnosis not present

## 2019-05-12 MED ORDER — CEFTRIAXONE SODIUM 250 MG IJ SOLR
250.0000 mg | Freq: Once | INTRAMUSCULAR | Status: AC
  Administered 2019-05-12: 16:00:00 250 mg via INTRAMUSCULAR

## 2019-05-12 MED ORDER — AZITHROMYCIN 500 MG PO TABS: 1000.0000 mg | ORAL_TABLET | Freq: Once | ORAL | 0 refills | Status: AC

## 2019-05-12 NOTE — Patient Instructions (Addendum)
If you have lab work done today you will be contacted with your lab results within the next 2 weeks.  If you have not heard from [place] then please contact us. The fastest way to get your results is to register for My Chart.   IF you received an x-ray today, you will receive an invoice from [facility]. Please contact [facility] at [phone] with questions or concerns regarding your invoice.   IF you received labwork today, you will receive an invoice from [facility]. Please contact [facility] at [phone] with questions or concerns regarding your invoice.   Our billing staff will not be able to assist you with questions regarding bills from these companies.  You will be contacted with the lab results as soon as they are available. The fastest way to get your results is to activate your My Chart account. Instructions are located on the last page of this paperwork. If you have not heard from [place] regarding the results in 2 weeks, please contact this office.      Safe Sex Practicing safe sex means taking steps before and during sex to reduce your risk of:  Getting an STD (sexually transmitted disease).  Giving your partner an STD.  Unwanted pregnancy. How can I practice safe sex? To practice safe sex:  Limit your sexual partners to only one partner who is having sex with only you.  Avoid using alcohol and recreational drugs before having sex. These substances can affect your judgment.  Before having sex with a new partner: ? Talk to your partner about past partners, past STDs, and drug use. ? You and your partner should be screened for STDs and discuss the results with each other.  Check your body regularly for sores, blisters, rashes, or unusual discharge. If you notice any of these problems, visit your health care provider.  If you have symptoms of an infection or you are being treated for an STD, avoid sexual contact.  While having sex, use a condom. Make  sure to: ? Use a condom every time you have vaginal, oral, or anal sex. Both females and males should wear condoms during oral sex. ? Keep condoms in place from the beginning to the end of sexual activity. ? Use a latex condom, if possible. Latex condoms offer the best protection. ? Use only water-based lubricants or oils to lubricate a condom. Using petroleum-based lubricants or oils will weaken the condom and increase the chance that it will break.  See your health care provider for regular screenings, exams, and tests for STDs.  Talk with your health care provider about the form of birth control (contraception) that is best for you.  Get vaccinated against hepatitis B and human papillomavirus (HPV).  If you are at risk of being infected with HIV (human immunodeficiency virus), talk with your health care provider about taking a prescription medicine to prevent HIV infection. You are considered at risk for HIV if: ? You are a man who has sex with other men. ? You are a heterosexual man or woman who is sexually active with more than one partner. ? You take drugs by injection. ? You are sexually active with a partner who has HIV. This information is not intended to replace advice given to you by your health care provider. Make sure you discuss any questions you have with your health care provider. Document Released: 01/08/2005 Document Revised: 04/16/2016 Document Reviewed: 10/21/2015 Elsevier Interactive Patient Education  2019 [employer].

## 2019-05-12 NOTE — Progress Notes (Signed)
Established Patient Office Visit  Subjective:  Patient ID: Chad Nicholson, male    DOB: Jun 25, 1991  Age: 28 y.o. MRN: [id number]  CC:  Chief Complaint  Patient presents with  . Exposure to STD    partner tested pos for chlamydia  . Dysuria    unable to feel relief  of urination  (going on a week)    HPI Chad Nicholson presents for   Patient reports that he has intermittent sensation of urinary discomfort. Sensation of inability to fully empty his bladder He feels like when he urinates then he feels a sensation like he has to urinate again He denies flank pain, penile discharge   No past medical history on file.  No past surgical history on file.  No family history on file.  Social History   Socioeconomic History  . Marital status: Single    Spouse name: Not on file  . Number of children: Not on file  . Years of education: Not on file  . Highest education level: Not on file  Occupational History  . Not on file  Social Needs  . Financial resource strain: Not on file  . Food insecurity    Worry: Not on file    Inability: Not on file  . Transportation needs    Medical: Not on file    Non-medical: Not on file  Tobacco Use  . Smoking status: Former Smoker    Packs/day: 1.00    Years: 7.00    Pack years: 7.00    Types: Cigarettes    Quit date: 05/07/2017    Years since quitting: 2.0  . Smokeless tobacco: Never Used  Substance and Sexual Activity  . Alcohol use: No  . Drug use: No  . Sexual activity: Yes    Birth control/protection: Condom  Lifestyle  . Physical activity    Days per week: Not on file    Minutes per session: Not on file  . Stress: Not on file  Relationships  . Social Musician on phone: Not on file    Gets together: Not on file    Attends religious service: Not on file    Active member of club or organization: Not on file    Attends meetings of clubs or organizations: Not on file    Relationship status: Not on file  .  Intimate partner violence    Fear of current or ex partner: Not on file    Emotionally abused: Not on file    Physically abused: Not on file    Forced sexual activity: Not on file  Other Topics Concern  . Not on file  Social History Narrative  . Not on file    Outpatient Medications Prior to Visit  Medication Sig Dispense Refill  . HYDROcodone-acetaminophen (NORCO/VICODIN) 5-325 MG tablet Take 1-2 tablets by mouth every 4 (four) hours as needed. 15 tablet 0  . ibuprofen (ADVIL,MOTRIN) 800 MG tablet Take 1 tablet (800 mg total) by mouth 3 (three) times daily. 21 tablet 0  . tiZANidine (ZANAFLEX) 4 MG tablet Take 1-2 tablets (4-8 mg total) by mouth every 6 (six) hours as needed for muscle spasms. 21 tablet 0   No facility-administered medications prior to visit.     No Known Allergies  ROS Review of Systems    Objective:    Physical Exam  BP (!) 155/82 (BP Location: Right Arm, Patient Position: Sitting, Cuff Size: Normal)   Pulse 80   Temp 98.9 F (  37.2 C) (Oral)   Ht 6\' 1"  (1.854 m)   Wt 158 lb (71.7 kg)   SpO2 100%   BMI 20.85 kg/m  Wt Readings from Last 3 Encounters:  05/12/19 158 lb (71.7 kg)  12/18/18 155 lb (70.3 kg)  12/17/18 155 lb (70.3 kg)   Physical Exam  Constitutional: Oriented to person, place, and time. Appears well-developed and well-nourished.  HENT:  Head: Normocephalic and atraumatic.  Eyes: Conjunctivae and EOM are normal.  Cardiovascular: Normal rate, regular rhythm, normal heart sounds and intact distal pulses.  No murmur heard. Pulmonary/Chest: Effort normal and breath sounds normal. No stridor. No respiratory distress. Has no wheezes.  Neurological: Is alert and oriented to person, place, and time.  Skin: Skin is warm. Capillary refill takes less than 2 seconds.  Psychiatric: Has a normal mood and affect. Behavior is normal. Judgment and thought content normal.    There are no preventive care reminders to display for this patient.   There are no preventive care reminders to display for this patient.  Lab Results  Component Value Date   TSH 0.93 09/24/2016   Lab Results  Component Value Date   WBC 4.5 (A) 09/24/2016   HGB 15.3 09/24/2016   HCT 43.5 09/24/2016   MCV 92.0 09/24/2016   PLT 155 07/11/2012   Lab Results  Component Value Date   NA 144 09/24/2016   K 3.6 09/24/2016   CO2 26 09/24/2016   GLUCOSE 72 09/24/2016   BUN 7 09/24/2016   CREATININE 0.86 09/24/2016   BILITOT 0.6 09/24/2016   ALKPHOS 48 09/24/2016   AST 12 09/24/2016   ALT 8 (L) 09/24/2016   PROT 6.8 09/24/2016   ALBUMIN 4.7 09/24/2016   CALCIUM 9.8 09/24/2016   No results found for: CHOL No results found for: HDL No results found for: LDLCALC No results found for: TRIG No results found for: CHOLHDL Lab Results  Component Value Date   HGBA1C 5.3 09/24/2016      Assessment & Plan:   Problem List Items Addressed This Visit    None    Visit Diagnoses    Exposure to STD    -  Primary   Relevant Medications   cefTRIAXone (ROCEPHIN) injection 250 mg (Completed)   Other Relevant Orders   GC/Chlamydia probe amp ([place] (Completed)   Hepatitis B surface antigen (Completed)   HIV Antibody (routine testing w rflx) (Completed)   RPR (Completed)   Dysuria       Relevant Orders   Urine Culture (Completed)   POCT urinalysis dipstick (Completed)     Will treat empirically and send cultures Discussed std prevention Discussed that UTI is still a possibility     Meds ordered this encounter  Medications  . cefTRIAXone (ROCEPHIN) injection 250 mg  . azithromycin (ZITHROMAX) 500 MG tablet    Sig: Take 2 tablets (1,000 mg total) by mouth once for 1 dose.    Dispense:  2 tablet    Refill:  0    Follow-up: No follow-ups on file.    Chad BosworthZoe A Lynn Sissel, MD

## 2019-05-13 LAB — POCT URINALYSIS DIP (MANUAL ENTRY)
Bilirubin, UA: NEGATIVE
Blood, UA: NEGATIVE
Glucose, UA: NEGATIVE mg/dL
Ketones, POC UA: NEGATIVE mg/dL
Nitrite, UA: NEGATIVE
Protein Ur, POC: NEGATIVE mg/dL
Spec Grav, UA: 1.02 (ref 1.010–1.025)
Urobilinogen, UA: 0.2 E.U./dL
pH, UA: 8.5 — AB (ref 5.0–8.0)

## 2019-05-13 LAB — RPR: RPR Ser Ql: NONREACTIVE

## 2019-05-13 LAB — HEPATITIS B SURFACE ANTIGEN: Hepatitis B Surface Ag: NEGATIVE

## 2019-05-13 LAB — HIV ANTIBODY (ROUTINE TESTING W REFLEX): HIV Screen 4th Generation wRfx: NONREACTIVE

## 2019-05-13 NOTE — Telephone Encounter (Signed)
Error

## 2019-05-14 LAB — URINE CULTURE: Organism ID, Bacteria: NO GROWTH

## 2019-05-16 LAB — GC/CHLAMYDIA PROBE AMP (~~LOC~~) NOT AT ARMC
Chlamydia: POSITIVE — AB
Neisseria Gonorrhea: NEGATIVE

## 2019-06-30 ENCOUNTER — Ambulatory Visit
Admission: EM | Admit: 2019-06-30 | Discharge: 2019-06-30 | Disposition: A | Discharge status: Home / Self Care | Payer: Managed Care, Other (non HMO) | Attending: Physician Assistant | Admitting: Physician Assistant

## 2019-06-30 ENCOUNTER — Other Ambulatory Visit: Payer: Self-pay

## 2019-06-30 DIAGNOSIS — K5900 Constipation, unspecified: Secondary | ICD-10-CM | POA: Insufficient documentation

## 2019-06-30 MED ORDER — MAGNESIUM CITRATE PO SOLN: 195.0000 mL | Freq: Once | ORAL | 0 refills | Status: AC

## 2019-06-30 MED ORDER — POLYETHYLENE GLYCOL 3350 17 G PO PACK: 17.0000 g | PACK | Freq: Every day | ORAL | 0 refills | Status: DC

## 2019-06-30 MED ORDER — OMEPRAZOLE 20 MG PO CPDR: 20.0000 mg | DELAYED_RELEASE_CAPSULE | Freq: Every day | ORAL | 0 refills | Status: DC

## 2019-06-30 NOTE — ED Triage Notes (Signed)
Pt states would like to tested for STD also. States one of his partners is having sx's but he is not.

## 2019-06-30 NOTE — ED Triage Notes (Signed)
Pt c/o center abdominal pain with constipation x1wk. States is passing stool just not normal.

## 2019-06-30 NOTE — ED Provider Notes (Signed)
[name]    CSN: [id number] Arrival date & time: 06/30/19  1202     History   Chief Complaint Chief Complaint  Patient presents with  . Fecal Impaction    HPI Chad Nicholson is a 28 y.o. male.   28 year old male comes in for 1 week history of abdominal bloating and constipation. States has small stools without straining. But has not had normal/full bowel movement. Denies abdominal pain. States feels bloated. Also has some pressure to the epigastric area that can travel up the chest. Denies nausea/vomiting. Denies URI symptoms such as cough, congestion, sore throat. Denies fever, chills, night sweats. Tried an enema without much relief.      History reviewed. No pertinent past medical history.  Patient Active Problem List   Diagnosis Date Noted  . Fatigue 09/24/2016    History reviewed. No pertinent surgical history.     Home Medications    Prior to Admission medications   Medication Sig Start Date End Date Taking? Authorizing Provider  magnesium citrate SOLN Take 195 mLs by mouth once for 1 dose. 06/30/19 06/30/19  Belinda FisherYu, Amy V, PA-C  omeprazole (PRILOSEC) 20 MG capsule Take 1 capsule (20 mg total) by mouth daily. 06/30/19   Cathie HoopsYu, Amy V, PA-C  polyethylene glycol (MIRALAX) 17 g packet Take 17 g by mouth daily. 06/30/19   Belinda FisherYu, Amy V, PA-C    Family History History reviewed. No pertinent family history.  Social History Social History   Tobacco Use  . Smoking status: Former Smoker    Packs/day: 1.00    Years: 7.00    Pack years: 7.00    Types: Cigarettes    Quit date: 05/07/2017    Years since quitting: 2.1  . Smokeless tobacco: Never Used  Substance Use Topics  . Alcohol use: No  . Drug use: No     Allergies   Patient has no known allergies.   Review of Systems Review of Systems  Reason unable to perform ROS: See HPI as above.     Physical Exam Triage Vital Signs ED Triage Vitals [06/30/19 1210]  Enc Vitals Group     BP (!) 148/79      Pulse Rate 81     Resp 16     Temp 98.7 F (37.1 C)     Temp Source Oral     SpO2 98 %     Weight      Height      Head Circumference      Peak Flow      Pain Score 0     Pain Loc      Pain Edu?      Excl. in GC?    No data found.  Updated Vital Signs BP (!) 148/79 (BP Location: Left Arm)   Pulse 81   Temp 98.7 F (37.1 C) (Oral)   Resp 16   SpO2 98%   Visual Acuity Right Eye Distance:   Left Eye Distance:   Bilateral Distance:    Right Eye Near:   Left Eye Near:    Bilateral Near:     Physical Exam Constitutional:      General: He is not in acute distress.    Appearance: He is well-developed.  HENT:     Head: Normocephalic and atraumatic.  Cardiovascular:     Rate and Rhythm: Normal rate and regular rhythm.     Heart sounds: Normal heart sounds. No murmur. No friction rub. No gallop.  Pulmonary:     Effort: Pulmonary effort is normal.     Breath sounds: Normal breath sounds. No wheezing or rales.  Abdominal:     General: Bowel sounds are normal.     Palpations: Abdomen is soft.     Tenderness: There is no abdominal tenderness. There is no right CVA tenderness, left CVA tenderness, guarding or rebound.  Skin:    General: Skin is warm and dry.  Neurological:     Mental Status: He is alert and oriented to person, place, and time.  Psychiatric:        Behavior: Behavior normal.        Judgment: Judgment normal.      UC Treatments / Results  Labs (all labs ordered are listed, but only abnormal results are displayed) Labs Reviewed  [place]    EKG   Radiology No results found.  Procedures Procedures (including critical care time)  Medications Ordered in UC Medications - No data to display  Initial Impression / Assessment and Plan / UC Course  I have reviewed the triage vital signs and the nursing notes.  Pertinent labs & imaging results that were available during my care of the patient were reviewed by me and  considered in my medical decision making (see chart for details).    Provided few options for constipation including colace, miralax, mag citrate. Patient would like to try mag citrate. Will provide miralax to help regulate bowel for the next week as well. Push fluids. Return precautions given.  Patient also requesting STD check. He is asymptomatic. States possible exposure to one of his partners. Cytology sent.  Final Clinical Impressions(s) / UC Diagnoses   Final diagnoses:  Constipation, unspecified constipation type    ED Prescriptions    Medication Sig Dispense Auth. Provider   magnesium citrate SOLN Take 195 mLs by mouth once for 1 dose. 195 mL Yu, Amy V, PA-C   polyethylene glycol (MIRALAX) 17 g packet Take 17 g by mouth daily. 14 each Tasia Catchings, Amy V, PA-C   omeprazole (PRILOSEC) 20 MG capsule Take 1 capsule (20 mg total) by mouth daily. 10 capsule Tobin Chad, [place] 06/30/19 1240

## 2019-06-30 NOTE — Discharge Instructions (Addendum)
Start mag citrate as directed. Miralax starting tomorrow to help regulate movement. Omeprazole for acid reflux. Keep hydrated, urine should be clear to pale yellow in color. Follow up with PCP if having chronic constipation problems.   Cytology sent, you will be contacted with any positive results that requires further treatment. Refrain from sexual activity and alcohol use for the next 7 days.

## 2019-07-04 ENCOUNTER — Encounter: Payer: Self-pay | Admitting: Family Medicine

## 2019-07-05 LAB — URINE CYTOLOGY ANCILLARY ONLY
Chlamydia: POSITIVE — AB
Neisseria Gonorrhea: NEGATIVE
Trichomonas: NEGATIVE

## 2019-07-06 ENCOUNTER — Telehealth: Payer: Self-pay | Admitting: Emergency Medicine

## 2019-07-06 MED ORDER — AZITHROMYCIN 250 MG PO TABS: 1000.0000 mg | ORAL_TABLET | Freq: Once | ORAL | 0 refills | Status: AC

## 2019-07-06 NOTE — Telephone Encounter (Signed)
Chlamydia is positive.  Rx po zithromax 1g #1 dose no refills was sent to the pharmacy of record.  Pt needs education to please refrain from sexual intercourse for 7 days to give the medicine time to work, sexual partners need to be notified and tested/treated.  Condoms may reduce risk of reinfection.  Recheck or followup with PCP for further evaluation if symptoms are not improving.   [facility] notified.  Attempted to reach patient. No answer at this time. Voicemail left.    

## 2019-07-27 ENCOUNTER — Ambulatory Visit
Admission: EM | Admit: 2019-07-27 | Discharge: 2019-07-27 | Disposition: A | Discharge status: Home / Self Care | Payer: Managed Care, Other (non HMO) | Attending: Physician Assistant | Admitting: Physician Assistant

## 2019-07-27 ENCOUNTER — Ambulatory Visit (INDEPENDENT_AMBULATORY_CARE_PROVIDER_SITE_OTHER): Payer: Managed Care, Other (non HMO)

## 2019-07-27 DIAGNOSIS — Y9367 Activity, basketball: Secondary | ICD-10-CM

## 2019-07-27 DIAGNOSIS — S92352A Displaced fracture of fifth metatarsal bone, left foot, initial encounter for closed fracture: Secondary | ICD-10-CM

## 2019-07-27 MED ORDER — HYDROCODONE-ACETAMINOPHEN 5-325 MG PO TABS: 2.0000 | ORAL_TABLET | Freq: Four times a day (QID) | ORAL | 0 refills | Status: AC | PRN

## 2019-07-27 NOTE — ED Provider Notes (Signed)
[name]    CSN: [id number] Arrival date & time: 07/27/19  1309     History   Chief Complaint Chief Complaint  Patient presents with  . Foot Injury    HPI Chad Nicholson is a 28 y.o. male.   28 year old male comes in for left foot pain after fall yesterday while playing basketball. States he landed on the lateral side of the left foot, and has been having trouble weightbearing to that area since then. Has swelling and intermittent numbness to the lateral foot. Took ibuprofen with some relief. Has been trying to use a walker to help with the symptoms.      History reviewed. No pertinent past medical history.  Patient Active Problem List   Diagnosis Date Noted  . Fatigue 09/24/2016    History reviewed. No pertinent surgical history.     Home Medications    Prior to Admission medications   Medication Sig Start Date End Date Taking? Authorizing Provider  HYDROcodone-acetaminophen (NORCO/VICODIN) 5-325 MG tablet Take 2 tablets by mouth every 6 (six) hours as needed for severe pain. 07/27/19   Belinda FisherYu, Kaho Selle V, PA-C    Family History History reviewed. No pertinent family history.  Social History Social History   Tobacco Use  . Smoking status: Former Smoker    Packs/day: 1.00    Years: 7.00    Pack years: 7.00    Types: Cigarettes    Quit date: 05/07/2017    Years since quitting: 2.2  . Smokeless tobacco: Never Used  Substance Use Topics  . Alcohol use: No  . Drug use: No     Allergies   Patient has no known allergies.   Review of Systems Review of Systems  Reason unable to perform ROS: See HPI as above.     Physical Exam Triage Vital Signs ED Triage Vitals  Enc Vitals Group     BP 07/27/19 1321 (!) 141/73     Pulse Rate 07/27/19 1321 80     Resp 07/27/19 1321 18     Temp 07/27/19 1321 98.1 F (36.7 C)     Temp src --      SpO2 07/27/19 1321 98 %     Weight --      Height --      Head Circumference --      Peak Flow --    Pain Score 07/27/19 1322 10     Pain Loc --      Pain Edu? --      Excl. in GC? --    No data found.  Updated Vital Signs BP (!) 141/73 (BP Location: Left Arm)   Pulse 80   Temp 98.1 F (36.7 C)   Resp 18   SpO2 98%   Physical Exam Constitutional:      General: He is not in acute distress.    Appearance: He is well-developed. He is not diaphoretic.  HENT:     Head: Normocephalic and atraumatic.  Eyes:     Conjunctiva/sclera: Conjunctivae normal.     Pupils: Pupils are equal, round, and reactive to light.  Musculoskeletal:     Comments: Mild swelling to the left lateral foot. No erythema, warmth, contusion. Tender to palpation along 5th MTP, worse proximally. No tenderness to palpation of the malleolus.  Range of motion and strength deferred.  Sensation intact and equal bilaterally.  Pedal pulse 2+.  Skin:    General: Skin is warm and dry.  Neurological:  Mental Status: He is alert and oriented to person, place, and time.      UC Treatments / Results  Labs (all labs ordered are listed, but only abnormal results are displayed) Labs Reviewed - No data to display  EKG   Radiology Dg Foot Complete Left  Result Date: 07/27/2019 CLINICAL DATA:  Foot injury base of fifth metatarsal during basketball EXAM: LEFT FOOT - COMPLETE 3+ VIEW COMPARISON:  None. FINDINGS: Flattening of the plantar arch. No dislocation. Acute mildly displaced fracture base of fifth metatarsal with extension of lucency to the fifth TMT joint. IMPRESSION: Acute minimally displaced intra-articular fracture base of fifth metatarsal. Electronically Signed   By: Donavan Foil M.D.   On: 07/27/2019 13:49    Procedures Procedures (including critical care time)  Medications Ordered in UC Medications - No data to display  Initial Impression / Assessment and Plan / UC Course  I have reviewed the triage vital signs and the nursing notes.  Pertinent labs & imaging results that were available during my  care of the patient were reviewed by me and considered in my medical decision making (see chart for details).    Discussed x-ray results with patient.  Will place patient on cam walker and crutches, and remain nonweightbearing until follow-up with orthopedics.  Symptomatic treatment discussed.  Ice compress, elevation.  Patient to follow-up with orthopedic for further evaluation and management needed.  Return precautions given.  Patient expresses understanding and agrees to plan.  Final Clinical Impressions(s) / UC Diagnoses   Final diagnoses:  Closed displaced fracture of fifth metatarsal bone of left foot, initial encounter    ED Prescriptions    Medication Sig Dispense Auth. Provider   HYDROcodone-acetaminophen (NORCO/VICODIN) 5-325 MG tablet Take 2 tablets by mouth every 6 (six) hours as needed for severe pain. 10 tablet Ok Edwards, PA-C     Controlled Substance Prescriptions [place] Controlled Substance Registry consulted? Yes, I have consulted the [place] Controlled Substances Registry for this patient, and feel the risk/benefit ratio today is favorable for proceeding with this prescription for a controlled substance.   Ok Edwards, PA-C 07/27/19 1413

## 2019-07-27 NOTE — Discharge Instructions (Addendum)
As discussed, you have a fracture to the left foot. Start ibuprofen 800mg  three times a day. You can supplement with tylenol 650mg  three times a day. If still with pain, can use norco. Norco also contains 325mg  of tylenol per pill, do not exceed 1000mg  every 8 hours, and do not exceed 3000mg  in 24 hours of tylenol. Ice compress, elevation. Wear cam walker and do not bear weight until cleared by orthopedics. Please follow up with ortho this week or next week for reevaluation needed.

## 2019-07-27 NOTE — ED Triage Notes (Signed)
Pt states playing basketball and came down wrong injuring his lt foot. States pain to lt side and top of lt foot.

## 2020-09-08 ENCOUNTER — Ambulatory Visit
Admission: EM | Admit: 2020-09-08 | Discharge: 2020-09-08 | Disposition: A | Discharge status: Home / Self Care | Payer: Managed Care, Other (non HMO) | Attending: Family Medicine | Admitting: Family Medicine

## 2020-09-08 ENCOUNTER — Other Ambulatory Visit: Payer: Self-pay

## 2020-09-08 ENCOUNTER — Encounter: Payer: Self-pay | Admitting: Emergency Medicine

## 2020-09-08 DIAGNOSIS — Z1152 Encounter for screening for COVID-19: Secondary | ICD-10-CM

## 2020-09-08 DIAGNOSIS — R0981 Nasal congestion: Secondary | ICD-10-CM

## 2020-09-08 DIAGNOSIS — R432 Parageusia: Secondary | ICD-10-CM

## 2020-09-08 NOTE — ED Triage Notes (Signed)
Pt sts nasal congestion x 1 week and sts could not taste his toothpaste this am

## 2020-09-08 NOTE — Discharge Instructions (Addendum)
COVID-19 test pending. Work note provided for 72 hours to allow time for test to result. Patient encouraged to self isolate while test is pending.  

## 2020-09-08 NOTE — ED Provider Notes (Signed)
[name]    CSN: [id number] Arrival date & time: 09/08/20  1053      History   Chief Complaint Chief Complaint  Patient presents with   Nasal Congestion    HPI Isael Stille is a 29 y.o. male.   HPI  Patient presents for COVID-19 testing after experiencing some nasal congestion with most of smell.  Patient is unvaccinated against COVID-19.   History reviewed. No pertinent past medical history.  Patient Active Problem List   Diagnosis Date Noted   Fatigue 09/24/2016    History reviewed. No pertinent surgical history.     Home Medications    Prior to Admission medications   Medication Sig Start Date End Date Taking? Authorizing Provider  HYDROcodone-acetaminophen (NORCO/VICODIN) 5-325 MG tablet Take 2 tablets by mouth every 6 (six) hours as needed for severe pain. Patient not taking: Reported on 09/08/2020 07/27/19   Lurline Idol    Family History History reviewed. No pertinent family history.  Social History Social History   Tobacco Use   Smoking status: Former Smoker    Packs/day: 1.00    Years: 7.00    Pack years: 7.00    Types: Cigarettes    Quit date: 05/07/2017    Years since quitting: 3.3   Smokeless tobacco: Never Used  Vaping Use   Vaping Use: Never used  Substance Use Topics   Alcohol use: No   Drug use: No     Allergies   Patient has no known allergies.   Review of Systems Review of Systems Pertinent negatives listed in HPI   Physical Exam Triage Vital Signs ED Triage Vitals  Enc Vitals Group     BP 09/08/20 1252 (!) 147/95     Pulse Rate 09/08/20 1252 63     Resp 09/08/20 1252 18     Temp 09/08/20 1252 98.1 F (36.7 C)     Temp Source 09/08/20 1252 Oral     SpO2 09/08/20 1252 99 %     Weight --      Height --      Head Circumference --      Peak Flow --      Pain Score 09/08/20 1253 0     Pain Loc --      Pain Edu? --      Excl. in GC? --    No data found.  Updated Vital Signs BP (!)  147/95 (BP Location: Right Arm)    Pulse 63    Temp 98.1 F (36.7 C) (Oral)    Resp 18    SpO2 99%   Visual Acuity Right Eye Distance:   Left Eye Distance:   Bilateral Distance:    Right Eye Near:   Left Eye Near:    Bilateral Near:     Physical Exam  General Appearance:    Alert, cooperative, no distress  HENT:   Sinus congestion   Eyes:    PERRL, conjunctiva/corneas clear, EOM's intact       Lungs:     Clear to auscultation bilaterally, respirations unlabored  Heart:    Regular rate and rhythm  Neurologic:   Awake, alert, oriented x 3. No apparent focal neurological           defect.         UC Treatments / Results  Labs (all labs ordered are listed, but only abnormal results are displayed) Labs Reviewed  NOVEL CORONAVIRUS, NAA    EKG   Radiology  No results found.  Procedures Procedures (including critical care time)  Medications Ordered in UC Medications - No data to display  Initial Impression / Assessment and Plan / UC Course  I have reviewed the triage vital signs and the nursing notes.  Pertinent labs & imaging results that were available during my care of the patient were reviewed by me and considered in my medical decision making (see chart for details).    COVID-19 test pending.  Work note provided total time for test result.  Advised to quarantine patient is symptomatic for symptoms suspicious for COVID-19.  Recommended OTC management of symptoms.  Patient did request antibiotics educated that antibiotics are not warranted in the course of a viral illness.  Also encouraged to obtain COVID-19 vaccine once Infection status is current. Final Clinical Impressions(s) / UC Diagnoses   Final diagnoses:  Encounter for screening for COVID-19     Discharge Instructions      COVID-19 test pending. Work note provided for 72 hours to allow time for test to result. Patient encouraged to self isolate while test is pending.      ED Prescriptions    None      PDMP not reviewed this encounter.   Bing Neighbors, FNP 09/08/20 1326

## 2020-09-10 LAB — SARS-COV-2, NAA 2 DAY TAT

## 2020-09-10 LAB — NOVEL CORONAVIRUS, NAA: SARS-CoV-2, NAA: NOT DETECTED

## 2022-04-19 ENCOUNTER — Ambulatory Visit
Admission: EM | Admit: 2022-04-19 | Discharge: 2022-04-19 | Disposition: A | Discharge status: Home / Self Care | Payer: 59 | Attending: Internal Medicine | Admitting: Internal Medicine

## 2022-04-19 ENCOUNTER — Encounter: Payer: Self-pay | Admitting: Emergency Medicine

## 2022-04-19 DIAGNOSIS — R310 Gross hematuria: Secondary | ICD-10-CM | POA: Diagnosis not present

## 2022-04-19 DIAGNOSIS — N390 Urinary tract infection, site not specified: Secondary | ICD-10-CM | POA: Insufficient documentation

## 2022-04-19 DIAGNOSIS — R3915 Urgency of urination: Secondary | ICD-10-CM | POA: Insufficient documentation

## 2022-04-19 DIAGNOSIS — Z113 Encounter for screening for infections with a predominantly sexual mode of transmission: Secondary | ICD-10-CM | POA: Insufficient documentation

## 2022-04-19 LAB — POCT URINALYSIS DIP (MANUAL ENTRY)
Bilirubin, UA: NEGATIVE
Glucose, UA: NEGATIVE mg/dL
Nitrite, UA: NEGATIVE
Protein Ur, POC: 100 mg/dL — AB
Spec Grav, UA: 1.025 (ref 1.010–1.025)
Urobilinogen, UA: 0.2 E.U./dL
pH, UA: 7 (ref 5.0–8.0)

## 2022-04-19 MED ORDER — NITROFURANTOIN MONOHYD MACRO 100 MG PO CAPS: 100.0000 mg | ORAL_CAPSULE | Freq: Two times a day (BID) | ORAL | 0 refills | Status: AC

## 2022-04-19 NOTE — ED Provider Notes (Signed)
?[name] ? ? ? ?CSN: [id number] ?Arrival date & time: 04/19/22  1129 ? ? ?  ? ?History   ?Chief Complaint ?Chief Complaint  ?Patient presents with  ? Hematuria  ? ? ?HPI ?Chad Nicholson is a 31 y.o. male.  ? ?Patient presents with hematuria that he noticed approximately 2 days ago.  Patient reports that he noticed it after lifting heavy packages at work.  Denies any associated back pain or body aches.  He denies urinary burning, penile discharge, testicular pain, back pain, fever.  He does endorse some urinary urgency as well as some lower abdominal pain.  Denies any known exposure to STD but has had unprotected sexual intercourse recently. ? ? ?Hematuria ? ? ?History reviewed. No pertinent past medical history. ? ?Patient Active Problem List  ? Diagnosis Date Noted  ? Fatigue 09/24/2016  ? ? ?History reviewed. No pertinent surgical history. ? ? ? ? ?Home Medications   ? ?Prior to Admission medications   ?Medication Sig Start Date End Date Taking? Authorizing Provider  ?nitrofurantoin, macrocrystal-monohydrate, (MACROBID) 100 MG capsule Take 1 capsule (100 mg total) by mouth 2 (two) times daily for 7 days. 04/19/22 04/26/22 Yes [name], FNP  ?HYDROcodone-acetaminophen (NORCO/VICODIN) 5-325 MG tablet Take 2 tablets by mouth every 6 (six) hours as needed for severe pain. ?Patient not taking: Reported on 09/08/2020 07/27/19   [name], PA-C  ? ? ?Family History ?History reviewed. No pertinent family history. ? ?Social History ?Social History  ? ?Tobacco Use  ? Smoking status: Former  ?  Packs/day: 1.00  ?  Years: 7.00  ?  Pack years: 7.00  ?  Types: Cigarettes  ?  Quit date: 05/07/2017  ?  Years since quitting: 4.9  ? Smokeless tobacco: Never  ?Vaping Use  ? Vaping Use: Never used  ?Substance Use Topics  ? Alcohol use: No  ? Drug use: No  ? ? ? ?Allergies   ?Patient has no known allergies. ? ? ?Review of Systems ?Review of Systems ?Per HPI ? ?Physical Exam ?Triage Vital Signs ?ED Triage Vitals  ?Enc  Vitals Group  ?   BP 04/19/22 1231 132/78  ?   Pulse Rate 04/19/22 1231 65  ?   Resp 04/19/22 1231 18  ?   Temp 04/19/22 1231 98.1 ?F (36.7 ?C)  ?   Temp Source 04/19/22 1231 Oral  ?   SpO2 04/19/22 1231 98 %  ?   Weight 04/19/22 1232 165 lb (74.8 kg)  ?   Height 04/19/22 1232 6\' 1"  (1.854 m)  ?   Head Circumference --   ?   Peak Flow --   ?   Pain Score 04/19/22 1232 4  ?   Pain Loc --   ?   Pain Edu? --   ?   Excl. in GC? --   ? ?No data found. ? ?Updated Vital Signs ?BP 132/78 (BP Location: Left Arm)   Pulse 65   Temp 98.1 ?F (36.7 ?C) (Oral)   Resp 18   Ht 6\' 1"  (1.854 m)   Wt 165 lb (74.8 kg)   SpO2 98%   BMI 21.77 kg/m?  ? ?Visual Acuity ?Right Eye Distance:   ?Left Eye Distance:   ?Bilateral Distance:   ? ?Right Eye Near:   ?Left Eye Near:    ?Bilateral Near:    ? ?Physical Exam ?Constitutional:   ?   General: He is not in acute distress. ?   Appearance: Normal  appearance. He is not toxic-appearing or diaphoretic.  ?HENT:  ?   Head: Normocephalic and atraumatic.  ?Eyes:  ?   Extraocular Movements: Extraocular movements intact.  ?   Conjunctiva/sclera: Conjunctivae normal.  ?Cardiovascular:  ?   Rate and Rhythm: Normal rate and regular rhythm.  ?   Pulses: Normal pulses.  ?   Heart sounds: Normal heart sounds.  ?Pulmonary:  ?   Effort: Pulmonary effort is normal. No respiratory distress.  ?   Breath sounds: Normal breath sounds.  ?Abdominal:  ?   General: Bowel sounds are normal. There is no distension.  ?   Palpations: Abdomen is soft.  ?   Tenderness: There is no abdominal tenderness.  ?Genitourinary: ?   Comments: Deferred with shared decision-making.  Self swab performed. ?Neurological:  ?   General: No focal deficit present.  ?   Mental Status: He is alert and oriented to person, place, and time. Mental status is at baseline.  ?Psychiatric:     ?   Mood and Affect: Mood normal.     ?   Behavior: Behavior normal.     ?   Thought Content: Thought content normal.     ?   Judgment: Judgment normal.   ? ? ? ?UC Treatments / Results  ?Labs ?(all labs ordered are listed, but only abnormal results are displayed) ?Labs Reviewed  ?POCT URINALYSIS DIP (MANUAL ENTRY) - Abnormal; Notable for the following components:  ?    Result Value  ? Color, UA other (*)   ? Ketones, POC UA trace (5) (*)   ? Blood, UA large (*)   ? Protein Ur, POC =100 (*)   ? Leukocytes, UA Moderate (2+) (*)   ? All other components within normal limits  ?URINE CULTURE  ?CYTOLOGY, (ORAL, ANAL, URETHRAL) ANCILLARY ONLY  ? ? ?EKG ? ? ?Radiology ?No results found. ? ?Procedures ?Procedures (including critical care time) ? ?Medications Ordered in UC ?Medications - No data to display ? ?Initial Impression / Assessment and Plan / UC Course  ?I have reviewed the triage vital signs and the nursing notes. ? ?Pertinent labs & imaging results that were available during my care of the patient were reviewed by me and considered in my medical decision making (see chart for details). ? ?  ? ?Urinalysis indicating possible urinary tract infection.  Will treat with Macrobid antibiotic.  Urine culture and cytology swab pending to rule out other etiologies.  Will wait for results for any further treatment at this time.  Patient to refrain from sexual activity until test results and treatment are complete.  No concern for rhabdomyolysis related to hematuria and lifting heavy package.  Discussed return precautions.  Patient verbalized understanding and was agreeable with plan. ?Final Clinical Impressions(s) / UC Diagnoses  ? ?Final diagnoses:  ?Gross hematuria  ?Urinary urgency  ?Lower urinary tract infection  ?Screening examination for venereal disease  ? ? ? ?Discharge Instructions   ? ?  ?It appears that you may have a urinary tract infection which is being treated with an antibiotic.  Urine culture and penile swab are pending.  We will call if results are abnormal and treat as appropriate.  Please refrain from sexual activity until test results and treatment are  complete. ? ? ? ?ED Prescriptions   ? ? Medication Sig Dispense Auth. Provider  ? nitrofurantoin, macrocrystal-monohydrate, (MACROBID) 100 MG capsule Take 1 capsule (100 mg total) by mouth 2 (two) times daily for 7 days. 14 capsule  Gustavus BryantMound, Jaidee Stipe E, FNP  ? ?  ? ?PDMP not reviewed this encounter. ?  ?Gustavus BryantMound, Nobuo Nunziata E, [place]NP ?04/19/22 1306 ? ?

## 2022-04-19 NOTE — ED Triage Notes (Signed)
Patient c/o hematuria x 2 days.  Noticed after lifting heavy packages at work on Thursday.  No dysuria, urgency or frequency.  No concern for STIs.  Pt doesn't feel that he's emptying his bladder completely. ?

## 2022-04-19 NOTE — Discharge Instructions (Signed)
It appears that you may have a urinary tract infection which is being treated with an antibiotic.  Urine culture and penile swab are pending.  We will call if results are abnormal and treat as appropriate.  Please refrain from sexual activity until test results and treatment are complete. ?

## 2022-04-21 LAB — CYTOLOGY, (ORAL, ANAL, URETHRAL) ANCILLARY ONLY
Chlamydia: NEGATIVE
Comment: NEGATIVE
Comment: NEGATIVE
Comment: NORMAL
Neisseria Gonorrhea: NEGATIVE
Trichomonas: NEGATIVE

## 2022-04-22 LAB — URINE CULTURE: Culture: >=100000 — AB
